# Patient Record
Sex: Female | Born: 1990 | Hispanic: No | Marital: Single | Smoking: Never smoker
Health system: Southern US, Community
[De-identification: ages and names within clinical notes are randomized; demographics above are authoritative.]

## PROBLEM LIST (undated history)

## (undated) DIAGNOSIS — R011 Cardiac murmur, unspecified: Secondary | ICD-10-CM

## (undated) HISTORY — PX: ELBOW SURGERY: SHX618

---

## 2015-08-10 ENCOUNTER — Encounter (HOSPITAL_COMMUNITY): Payer: Self-pay | Admitting: Emergency Medicine

## 2015-08-10 ENCOUNTER — Observation Stay (HOSPITAL_COMMUNITY)
Admission: EM | Admit: 2015-08-10 | Discharge: 2015-08-12 | Disposition: A | Discharge status: Home / Self Care | Payer: Self-pay | Attending: Emergency Medicine | Admitting: Emergency Medicine

## 2015-08-10 DIAGNOSIS — K3580 Unspecified acute appendicitis: Secondary | ICD-10-CM | POA: Diagnosis present

## 2015-08-10 DIAGNOSIS — K353 Acute appendicitis with localized peritonitis, without perforation or gangrene: Secondary | ICD-10-CM

## 2015-08-10 HISTORY — DX: Cardiac murmur, unspecified: R01.1

## 2015-08-10 NOTE — ED Triage Notes (Signed)
Pt c/o RLQ abd cramping radiating to low pelvis onset yesterday, worsening today with emesis x 3 today.

## 2015-08-10 NOTE — ED Provider Notes (Signed)
[facility]-EMERGENCY DEPT Provider Note   CSN: [id number] Arrival date & time: 08/10/15  2221  First Provider Contact:  First MD Initiated Contact with Patient 08/10/15 2343   By signing my name below, I, Erica Hinton, attest that this documentation has been prepared under the direction and in the presence of Erica Anis, PA-C. Electronically Signed: Bridgette Hinton, ED Scribe. 08/10/15. 11:49 PM.    History   Chief Complaint Chief Complaint  Patient presents with  . Abdominal Pain    HPI Comments: Erica Hinton is a 25 y.o. female who presents to the Emergency Department complaining of sudden onset, constant, cramping RLQ abdominal pain radiating to low pelvis onset one day ago. Pt states the pain worsened today. Pt also has associated vomiting (x3 episodes) onset today. She reports that this feels like menstrual cramping but notes this is significantly worse. Per pt, pain is exacerbated with movement. She states she has normal appetite. Her LNMP was 4 days ago. Denies h/o abdominal surgeries. Pt denies fever, vaginal discharge, chest pain, SOB, and nausea.   The history is provided by the patient. No language interpreter was used.    Past Medical History:  Diagnosis Date  . Heart murmur     There are no active problems to display for this patient.   Past Surgical History:  Procedure Laterality Date  . ELBOW SURGERY      OB History    No data available       Home Medications    Prior to Admission medications   Medication Sig Start Date End Date Taking? Authorizing Provider  BIOTIN PO Take 1 tablet by mouth daily.   Yes Historical Provider, MD    Family History No family history on file.  Social History Social History  Substance Use Topics  . Smoking status: Never Smoker  . Smokeless tobacco: Never Used  . Alcohol use No     Allergies   Review of patient's allergies indicates no known allergies.   Review of Systems Review of Systems  Constitutional: Negative  for appetite change and fever.  Respiratory: Negative for shortness of breath.   Cardiovascular: Negative for chest pain.  Gastrointestinal: Positive for abdominal pain and vomiting. Negative for nausea.  Genitourinary: Negative for vaginal discharge.     Physical Exam Updated Vital Signs BP 123/63   Pulse 77   Temp 98.6 F (37 C) (Oral)   Resp 18   LMP 08/06/2015   SpO2 98%   Physical Exam  Constitutional: She appears well-developed and well-nourished.  HENT:  Head: Normocephalic.  Eyes: Conjunctivae are normal.  Cardiovascular: Normal rate, regular rhythm and normal heart sounds.  Exam reveals no gallop and no friction rub.   No murmur heard. Pulmonary/Chest: Effort normal and breath sounds normal. No respiratory distress. She has no wheezes. She has no rales.  Abdominal: Soft. She exhibits no distension. There is tenderness. There is rebound.  RLQ tenderness over McBurney's Point with rebounding.  Genitourinary: Uterus is not enlarged and not tender. Cervix exhibits no motion tenderness, no discharge and no friability. Right adnexum displays no tenderness. Left adnexum displays no tenderness. No erythema or tenderness in the vagina. No vaginal discharge found.  Genitourinary Comments: Examination chaperoned.  Musculoskeletal: Normal range of motion.  Neurological: She is alert.  Skin: Skin is warm and dry.  Psychiatric: She has a normal mood and affect. Her behavior is normal.  Nursing note and vitals reviewed.    ED Treatments / Results  DIAGNOSTIC STUDIES: Oxygen Saturation  is 100% on RA, normal by my interpretation.    COORDINATION OF CARE: 11:45 PM Discussed treatment plan with pt at bedside which includes pelvic exam and pt agreed to plan.  Labs (all labs ordered are listed, but only abnormal results are displayed) Labs Reviewed  WET PREP, GENITAL - Abnormal; Notable for the following:       Result Value   Clue Cells Wet Prep HPF POC PRESENT (*)    WBC, Wet  Prep HPF POC FEW (*)    All other components within normal limits  COMPREHENSIVE METABOLIC PANEL - Abnormal; Notable for the following:    Glucose, Bld 124 (*)    Calcium 10.6 (*)    Total Protein 8.5 (*)    Total Bilirubin 1.4 (*)    All other components within normal limits  CBC - Abnormal; Notable for the following:    WBC 23.0 (*)    All other components within normal limits  URINALYSIS, ROUTINE W REFLEX MICROSCOPIC (NOT AT [facility]) - Abnormal; Notable for the following:    APPearance CLOUDY (*)    Ketones, ur 40 (*)    All other components within normal limits  LIPASE, BLOOD  POC URINE PREG, ED  GC/CHLAMYDIA PROBE AMP ([place]) NOT AT [facility]    EKG  EKG Interpretation None       Radiology No results found.  Procedures Procedures (including critical care time)  Medications Ordered in ED Medications  morphine 4 MG/ML injection 4 mg (4 mg Intravenous Given 08/11/15 0044)  ondansetron (ZOFRAN) injection 4 mg (4 mg Intravenous Given 08/11/15 0047)     Initial Impression / Assessment and Plan / ED Course  I have reviewed the triage vital signs and the nursing notes.  Pertinent labs & imaging results that were available during my care of the patient were reviewed by me and considered in my medical decision making (see chart for details).  Clinical Course    Patient with RLQ/pelvic abdominal pain x 1 day. No fever or diarrhea. Vomiting only with PO intake. Perserved appetite.  Leukocytosis elevated to 23K. Pelvic exam essentially negative. Suspect appendicitis with focal RLQ tenderness and rebounding.   2:40 - CT showing acute appendicitis uncomplicated by rupture or abscess. General surgery paged.   2:45 - discussed with Dr. Maisie Fus who advises holding orders for admission. She will follow in hospital.  Final Clinical Impressions(s) / ED Diagnoses   Final diagnoses:  None   1. Acute appendicitis  New Prescriptions New Prescriptions   No medications on file    I personally performed the services described in this documentation, which was scribed in my presence. The recorded information has been reviewed and is accurate.      Erica Anis, PA-C 08/11/15 0244    Erica Palumbo, MD 08/11/15 [phone]

## 2015-08-11 ENCOUNTER — Inpatient Hospital Stay (HOSPITAL_COMMUNITY): Payer: Self-pay | Admitting: Certified Registered Nurse Anesthetist

## 2015-08-11 ENCOUNTER — Encounter (HOSPITAL_COMMUNITY): Payer: Self-pay | Admitting: Radiology

## 2015-08-11 ENCOUNTER — Emergency Department (HOSPITAL_COMMUNITY): Payer: Self-pay

## 2015-08-11 ENCOUNTER — Encounter (HOSPITAL_COMMUNITY): Admission: EM | Disposition: A | Discharge status: Home / Self Care | Payer: Self-pay | Source: Home / Self Care

## 2015-08-11 DIAGNOSIS — K3580 Unspecified acute appendicitis: Secondary | ICD-10-CM | POA: Diagnosis present

## 2015-08-11 HISTORY — PX: LAPAROSCOPIC APPENDECTOMY: SHX408

## 2015-08-11 LAB — COMPREHENSIVE METABOLIC PANEL
ALBUMIN: 4.8 g/dL (ref 3.5–5.0)
ALT: 15 U/L (ref 14–54)
ANION GAP: 9 (ref 5–15)
AST: 18 U/L (ref 15–41)
Alkaline Phosphatase: 52 U/L (ref 38–126)
BILIRUBIN TOTAL: 1.4 mg/dL — AB (ref 0.3–1.2)
BUN: 6 mg/dL (ref 6–20)
CHLORIDE: 103 mmol/L (ref 101–111)
CO2: 23 mmol/L (ref 22–32)
Calcium: 10.6 mg/dL — ABNORMAL HIGH (ref 8.9–10.3)
Creatinine, Ser: 0.74 mg/dL (ref 0.44–1.00)
GFR calc Af Amer: >60 mL/min (ref >60)
GFR calc non Af Amer: >60 mL/min (ref >60)
GLUCOSE: 124 mg/dL — AB (ref 65–99)
POTASSIUM: 3.8 mmol/L (ref 3.5–5.1)
SODIUM: 135 mmol/L (ref 135–145)
TOTAL PROTEIN: 8.5 g/dL — AB (ref 6.5–8.1)

## 2015-08-11 LAB — URINALYSIS, ROUTINE W REFLEX MICROSCOPIC
BILIRUBIN URINE: NEGATIVE
GLUCOSE, UA: NEGATIVE mg/dL
Hgb urine dipstick: NEGATIVE
Ketones, ur: 40 mg/dL — AB
LEUKOCYTES UA: NEGATIVE
NITRITE: NEGATIVE
PH: 6 (ref 5.0–8.0)
Protein, ur: NEGATIVE mg/dL
SPECIFIC GRAVITY, URINE: 1.008 (ref 1.005–1.030)

## 2015-08-11 LAB — POC URINE PREG, ED: Preg Test, Ur: NEGATIVE

## 2015-08-11 LAB — CBC
HEMATOCRIT: 42.8 % (ref 36.0–46.0)
HEMOGLOBIN: 15 g/dL (ref 12.0–15.0)
MCH: 32.5 pg (ref 26.0–34.0)
MCHC: 35 g/dL (ref 30.0–36.0)
MCV: 92.6 fL (ref 78.0–100.0)
Platelets: 261 10*3/uL (ref 150–400)
RBC: 4.62 MIL/uL (ref 3.87–5.11)
RDW: 12.7 % (ref 11.5–15.5)
WBC: 23 10*3/uL — ABNORMAL HIGH (ref 4.0–10.5)

## 2015-08-11 LAB — WET PREP, GENITAL
SPERM: NONE SEEN
TRICH WET PREP: NONE SEEN
YEAST WET PREP: NONE SEEN

## 2015-08-11 LAB — LIPASE, BLOOD: LIPASE: 36 U/L (ref 11–51)

## 2015-08-11 SURGERY — APPENDECTOMY, LAPAROSCOPIC
Anesthesia: General | Site: Abdomen

## 2015-08-11 MED ORDER — MORPHINE SULFATE (PF) 4 MG/ML IV SOLN
4.0000 mg | Freq: Once | INTRAVENOUS | Status: AC
  Administered 2015-08-11: 4 mg via INTRAVENOUS
  Filled 2015-08-11: qty 1

## 2015-08-11 MED ORDER — PROPOFOL 10 MG/ML IV BOLUS
INTRAVENOUS | Status: AC
  Filled 2015-08-11: qty 20

## 2015-08-11 MED ORDER — FENTANYL CITRATE (PF) 100 MCG/2ML IJ SOLN
25.0000 ug | INTRAMUSCULAR | Status: DC | PRN
  Administered 2015-08-11: 50 ug via INTRAVENOUS

## 2015-08-11 MED ORDER — LIDOCAINE 2% (20 MG/ML) 5 ML SYRINGE
INTRAMUSCULAR | Status: DC | PRN
  Administered 2015-08-11: 100 mg via INTRAVENOUS

## 2015-08-11 MED ORDER — FENTANYL CITRATE (PF) 250 MCG/5ML IJ SOLN
INTRAMUSCULAR | Status: AC
  Filled 2015-08-11: qty 5

## 2015-08-11 MED ORDER — GLYCOPYRROLATE 0.2 MG/ML IJ SOLN
INTRAMUSCULAR | Status: DC | PRN
  Administered 2015-08-11: 0.4 mg via INTRAVENOUS

## 2015-08-11 MED ORDER — ONDANSETRON 4 MG PO TBDP: 4.0000 mg | ORAL_TABLET | Freq: Four times a day (QID) | ORAL | Status: DC | PRN

## 2015-08-11 MED ORDER — ONDANSETRON HCL 4 MG/2ML IJ SOLN: 4.0000 mg | Freq: Four times a day (QID) | INTRAMUSCULAR | Status: DC | PRN

## 2015-08-11 MED ORDER — NEOSTIGMINE METHYLSULFATE 10 MG/10ML IV SOLN
INTRAVENOUS | Status: DC | PRN
  Administered 2015-08-11: 3 mg via INTRAVENOUS

## 2015-08-11 MED ORDER — LACTATED RINGERS IV SOLN
INTRAVENOUS | Status: DC | PRN
  Administered 2015-08-11: 08:00:00 via INTRAVENOUS

## 2015-08-11 MED ORDER — MORPHINE SULFATE (PF) 2 MG/ML IV SOLN
2.0000 mg | INTRAVENOUS | Status: DC | PRN
  Administered 2015-08-11: 2 mg via INTRAVENOUS
  Administered 2015-08-11: 4 mg via INTRAVENOUS
  Administered 2015-08-11 – 2015-08-12 (×4): 2 mg via INTRAVENOUS
  Filled 2015-08-11 (×2): qty 1
  Filled 2015-08-11: qty 2
  Filled 2015-08-11 (×4): qty 1

## 2015-08-11 MED ORDER — LACTATED RINGERS IV SOLN: INTRAVENOUS | Status: DC

## 2015-08-11 MED ORDER — BUPIVACAINE-EPINEPHRINE 0.25% -1:200000 IJ SOLN
INTRAMUSCULAR | Status: AC
  Filled 2015-08-11: qty 1

## 2015-08-11 MED ORDER — SIMETHICONE 80 MG PO CHEW
40.0000 mg | CHEWABLE_TABLET | Freq: Four times a day (QID) | ORAL | Status: DC | PRN
  Administered 2015-08-11: 40 mg via ORAL
  Filled 2015-08-11: qty 1

## 2015-08-11 MED ORDER — DEXTROSE 5 % IV SOLN
2.0000 g | Freq: Once | INTRAVENOUS | Status: AC
  Administered 2015-08-11: 2 g via INTRAVENOUS
  Filled 2015-08-11: qty 2

## 2015-08-11 MED ORDER — FENTANYL CITRATE (PF) 100 MCG/2ML IJ SOLN
INTRAMUSCULAR | Status: AC
  Filled 2015-08-11: qty 2

## 2015-08-11 MED ORDER — BISACODYL 10 MG RE SUPP: 10.0000 mg | Freq: Every day | RECTAL | Status: DC | PRN

## 2015-08-11 MED ORDER — PROMETHAZINE HCL 25 MG/ML IJ SOLN: 6.2500 mg | INTRAMUSCULAR | Status: DC | PRN

## 2015-08-11 MED ORDER — ROCURONIUM BROMIDE 100 MG/10ML IV SOLN
INTRAVENOUS | Status: DC | PRN
  Administered 2015-08-11: 30 mg via INTRAVENOUS

## 2015-08-11 MED ORDER — SUCCINYLCHOLINE CHLORIDE 200 MG/10ML IV SOSY
PREFILLED_SYRINGE | INTRAVENOUS | Status: DC | PRN
  Administered 2015-08-11: 80 mg via INTRAVENOUS

## 2015-08-11 MED ORDER — LIDOCAINE HCL (CARDIAC) 20 MG/ML IV SOLN
INTRAVENOUS | Status: AC
  Filled 2015-08-11: qty 5

## 2015-08-11 MED ORDER — MIDAZOLAM HCL 2 MG/2ML IJ SOLN
INTRAMUSCULAR | Status: AC
  Filled 2015-08-11: qty 2

## 2015-08-11 MED ORDER — ROCURONIUM BROMIDE 100 MG/10ML IV SOLN
INTRAVENOUS | Status: AC
  Filled 2015-08-11: qty 1

## 2015-08-11 MED ORDER — SODIUM CHLORIDE 0.9 % IV SOLN
INTRAVENOUS | Status: AC
  Administered 2015-08-11: 05:00:00 via INTRAVENOUS

## 2015-08-11 MED ORDER — IOPAMIDOL (ISOVUE-300) INJECTION 61%
100.0000 mL | Freq: Once | INTRAVENOUS | Status: AC | PRN
  Administered 2015-08-11: 100 mL via INTRAVENOUS

## 2015-08-11 MED ORDER — METRONIDAZOLE IN NACL 5-0.79 MG/ML-% IV SOLN
500.0000 mg | Freq: Once | INTRAVENOUS | Status: AC
  Administered 2015-08-11: 500 mg via INTRAVENOUS
  Filled 2015-08-11: qty 100

## 2015-08-11 MED ORDER — NEOSTIGMINE METHYLSULFATE 10 MG/10ML IV SOLN
INTRAVENOUS | Status: AC
  Filled 2015-08-11: qty 1

## 2015-08-11 MED ORDER — PROPOFOL 10 MG/ML IV BOLUS
INTRAVENOUS | Status: DC | PRN
  Administered 2015-08-11: 150 mg via INTRAVENOUS

## 2015-08-11 MED ORDER — KCL IN DEXTROSE-NACL 20-5-0.45 MEQ/L-%-% IV SOLN
INTRAVENOUS | Status: DC
  Administered 2015-08-11: 11:00:00 via INTRAVENOUS
  Filled 2015-08-11 (×2): qty 1000

## 2015-08-11 MED ORDER — GLYCOPYRROLATE 0.2 MG/ML IJ SOLN
INTRAMUSCULAR | Status: AC
  Filled 2015-08-11: qty 2

## 2015-08-11 MED ORDER — SENNOSIDES-DOCUSATE SODIUM 8.6-50 MG PO TABS
1.0000 | ORAL_TABLET | Freq: Every day | ORAL | Status: DC
  Administered 2015-08-11: 1 via ORAL
  Filled 2015-08-11: qty 1

## 2015-08-11 MED ORDER — SODIUM CHLORIDE 0.9 % IV SOLN: INTRAVENOUS | Status: DC

## 2015-08-11 MED ORDER — ONDANSETRON HCL 4 MG/2ML IJ SOLN
INTRAMUSCULAR | Status: DC | PRN
  Administered 2015-08-11: 4 mg via INTRAVENOUS

## 2015-08-11 MED ORDER — FENTANYL CITRATE (PF) 100 MCG/2ML IJ SOLN
INTRAMUSCULAR | Status: DC | PRN
  Administered 2015-08-11: 50 ug via INTRAVENOUS
  Administered 2015-08-11: 100 ug via INTRAVENOUS
  Administered 2015-08-11 (×2): 50 ug via INTRAVENOUS

## 2015-08-11 MED ORDER — RINGERS IRRIGATION IR SOLN
Status: DC | PRN
  Administered 2015-08-11: 1000 mL

## 2015-08-11 MED ORDER — DEXAMETHASONE SODIUM PHOSPHATE 10 MG/ML IJ SOLN
INTRAMUSCULAR | Status: DC | PRN
  Administered 2015-08-11: 10 mg via INTRAVENOUS

## 2015-08-11 MED ORDER — HYDROCODONE-ACETAMINOPHEN 5-325 MG PO TABS
1.0000 | ORAL_TABLET | ORAL | Status: DC | PRN
  Administered 2015-08-11 (×2): 2 via ORAL
  Filled 2015-08-11 (×3): qty 2

## 2015-08-11 MED ORDER — MEPERIDINE HCL 50 MG/ML IJ SOLN: 6.2500 mg | INTRAMUSCULAR | Status: DC | PRN

## 2015-08-11 MED ORDER — ONDANSETRON HCL 4 MG/2ML IJ SOLN
INTRAMUSCULAR | Status: AC
  Filled 2015-08-11: qty 2

## 2015-08-11 MED ORDER — DEXAMETHASONE SODIUM PHOSPHATE 10 MG/ML IJ SOLN
INTRAMUSCULAR | Status: AC
  Filled 2015-08-11: qty 1

## 2015-08-11 MED ORDER — MIDAZOLAM HCL 5 MG/5ML IJ SOLN
INTRAMUSCULAR | Status: DC | PRN
  Administered 2015-08-11: 2 mg via INTRAVENOUS

## 2015-08-11 MED ORDER — ENOXAPARIN SODIUM 40 MG/0.4ML ~~LOC~~ SOLN
40.0000 mg | SUBCUTANEOUS | Status: DC
  Administered 2015-08-12: 40 mg via SUBCUTANEOUS
  Filled 2015-08-11: qty 0.4

## 2015-08-11 MED ORDER — ONDANSETRON HCL 4 MG/2ML IJ SOLN
4.0000 mg | Freq: Once | INTRAMUSCULAR | Status: AC
  Administered 2015-08-11: 4 mg via INTRAVENOUS
  Filled 2015-08-11: qty 2

## 2015-08-11 MED ORDER — 0.9 % SODIUM CHLORIDE (POUR BTL) OPTIME
TOPICAL | Status: DC | PRN
Start: 2015-08-11 — End: 2015-08-11
  Administered 2015-08-11: 1000 mL

## 2015-08-11 SURGICAL SUPPLY — 38 items
APPLIER CLIP 5 13 M/L LIGAMAX5 (MISCELLANEOUS)
APPLIER CLIP ROT 10 11.4 M/L (STAPLE)
CABLE HIGH FREQUENCY MONO STRZ (ELECTRODE) ×3 IMPLANT
CHLORAPREP W/TINT 26ML (MISCELLANEOUS) ×3 IMPLANT
CLIP APPLIE 5 13 M/L LIGAMAX5 (MISCELLANEOUS) IMPLANT
CLIP APPLIE ROT 10 11.4 M/L (STAPLE) IMPLANT
COVER SURGICAL LIGHT HANDLE (MISCELLANEOUS) ×3 IMPLANT
DECANTER SPIKE VIAL GLASS SM (MISCELLANEOUS) ×3 IMPLANT
DEVICE PMI PUNCTURE CLOSURE (MISCELLANEOUS) IMPLANT
DRAPE LAPAROSCOPIC ABDOMINAL (DRAPES) ×3 IMPLANT
ELECT REM PT RETURN 9FT ADLT (ELECTROSURGICAL) ×3
ELECTRODE REM PT RTRN 9FT ADLT (ELECTROSURGICAL) ×1 IMPLANT
GLOVE BIO SURGEON STRL SZ 6.5 (GLOVE) ×2 IMPLANT
GLOVE BIO SURGEONS STRL SZ 6.5 (GLOVE) ×1
GLOVE BIOGEL PI IND STRL 7.0 (GLOVE) ×1 IMPLANT
GLOVE BIOGEL PI INDICATOR 7.0 (GLOVE) ×2
GOWN STRL REUS W/TWL 2XL LVL3 (GOWN DISPOSABLE) ×3 IMPLANT
GOWN STRL REUS W/TWL XL LVL3 (GOWN DISPOSABLE) ×3 IMPLANT
HANDLE STAPLE EGIA 4 XL (STAPLE) ×3 IMPLANT
IRRIG SUCT STRYKERFLOW 2 WTIP (MISCELLANEOUS) ×3
IRRIGATION SUCT STRKRFLW 2 WTP (MISCELLANEOUS) ×1 IMPLANT
KIT BASIN OR (CUSTOM PROCEDURE TRAY) ×3 IMPLANT
LIQUID BAND (GAUZE/BANDAGES/DRESSINGS) ×3 IMPLANT
MARKER SKIN DUAL TIP RULER LAB (MISCELLANEOUS) IMPLANT
POUCH SPECIMEN RETRIEVAL 10MM (ENDOMECHANICALS) ×3 IMPLANT
RELOAD EGIA 45 MED/THCK PURPLE (STAPLE) ×3 IMPLANT
RELOAD EGIA 45 TAN VASC (STAPLE) ×3 IMPLANT
RELOAD EGIA 60 MED/THCK PURPLE (STAPLE) IMPLANT
RELOAD EGIA 60 TAN VASC (STAPLE) IMPLANT
SCISSORS LAP 5X35 DISP (ENDOMECHANICALS) ×3 IMPLANT
SLEEVE XCEL OPT CAN 5 100 (ENDOMECHANICALS) ×3 IMPLANT
SUT VIC AB 4-0 PS2 27 (SUTURE) ×3 IMPLANT
TOWEL OR 17X26 10 PK STRL BLUE (TOWEL DISPOSABLE) ×3 IMPLANT
TRAY FOLEY W/METER SILVER 14FR (SET/KITS/TRAYS/PACK) ×3 IMPLANT
TRAY FOLEY W/METER SILVER 16FR (SET/KITS/TRAYS/PACK) IMPLANT
TRAY LAPAROSCOPIC (CUSTOM PROCEDURE TRAY) ×3 IMPLANT
TROCAR BLADELESS OPT 5 100 (ENDOMECHANICALS) ×3 IMPLANT
TROCAR XCEL BLUNT TIP 100MML (ENDOMECHANICALS) ×3 IMPLANT

## 2015-08-11 NOTE — ED Notes (Signed)
Pelvic supplies at bedside. 

## 2015-08-11 NOTE — Anesthesia Postprocedure Evaluation (Signed)
Anesthesia Post Note  Patient: Erica Hinton  Procedure(s) Performed: Procedure(s) (LRB): APPENDECTOMY LAPAROSCOPIC (N/A)  Patient location during evaluation: PACU Anesthesia Type: General Level of consciousness: awake and alert Pain management: pain level controlled Vital Signs Assessment: post-procedure vital signs reviewed and stable Respiratory status: spontaneous breathing, nonlabored ventilation, respiratory function stable and patient connected to nasal cannula oxygen Cardiovascular status: blood pressure returned to baseline and stable Postop Assessment: no signs of nausea or vomiting Anesthetic complications: no    Last Vitals:  Vitals:   08/11/15 0929 08/11/15 0930  BP: 132/63 127/65  Pulse: 69 77  Resp: 20 18  Temp:      Last Pain:  Vitals:   08/11/15 0930  TempSrc:   PainSc: 6                  Sebastian Ache

## 2015-08-11 NOTE — Op Note (Signed)
Erica Hinton [id number]   PRE-OPERATIVE DIAGNOSIS:  appendicitis  POST-OPERATIVE DIAGNOSIS:  Acute appendicitis   Procedure(s): APPENDECTOMY LAPAROSCOPIC   Surgeon(s): Romie Levee, MD  ASSISTANT: none   ANESTHESIA:   local and general  EBL:   min  Delay start of Pharmacological VTE agent (>24hrs) due to surgical blood loss or risk of bleeding:  no  DRAINS: none   SPECIMEN:  Source of Specimen:  appendix  DISPOSITION OF SPECIMEN:  PATHOLOGY  COUNTS:  YES  PLAN OF CARE: Discharge to home after PACU  PATIENT DISPOSITION:  PACU - hemodynamically stable.   INDICATIONS: Patient with concerning symptoms & work up suspicious for appendicitis.  Surgery was recommended:  The anatomy & physiology of the digestive tract was discussed.  The pathophysiology of appendicitis was discussed.  Natural history risks without surgery was discussed.   I feel the risks of no intervention will lead to serious problems that outweigh the operative risks; therefore, I recommended diagnostic laparoscopy with removal of appendix to remove the pathology.  Laparoscopic & open techniques were discussed.   I noted a good likelihood this will help address the problem.    Risks such as bleeding, infection, abscess, leak, reoperation, possible ostomy, hernia, heart attack, death, and other risks were discussed.  Goals of post-operative recovery were discussed as well.  We will work to minimize complications.  Questions were answered.  The patient expresses understanding & wishes to proceed with surgery.  OR FINDINGS: acute appendicitis   DESCRIPTION:   The patient was identified & brought into the operating room. The patient was positioned supine with left arm tucked. SCDs were active during the entire case. The patient underwent general anesthesia without any difficulty.  A foley catheter was inserted under sterile conditions. The abdomen was prepped and draped in a sterile fashion. A Surgical Timeout  confirmed our plan.   I made a transverse incision through the superior umbilical fold.  I made a nick in the infraumbilical fascia and confirmed peritoneal entry.  I placed a stay suture and then the [facility] port.  We induced carbon dioxide insufflation.  Camera inspection revealed no injury.  I placed additional ports under direct laparoscopic visualization.  I mobilized the terminal ileum to proximal ascending colon in a lateral to medial fashion.  I took care to avoid injuring any retroperitoneal structures.   I freed the appendix off its attachments to the ascending colon and cecal mesentery.  I elevated the appendix.  I was able to free off the base of the appendix, which was still viable.  I stapled the appendix off the cecum using a laparoscopic bowel load stapler.  I took a healthy cuff viable cecum. I skeletonized & ligated the mesoappendix with a vascular load stapler.  I placed the appendix inside an EndoCatch bag and removed out the Dexter port.  I did copious irrigation. Hemostasis was good in the mesoappendix, colon mesentery, and retroperitoneum. Staple line was intact on the cecum with no bleeding. I washed out the pelvis, retrohepatic space and right paracolic gutter.  Hemostasis is good. There was no perforation or injury.  Because the area cleaned up well after irrigation, I did not place a drain.  I aspirated the carbon dioxide. I removed the ports. I closed the umbilical fascia site using a 0 Vicryl stitch. I closed skin using 4-0 vicryl stitch.  Sterile dressings were applied.  Patient was extubated and sent to the recovery room.  I discussed the operative findings with the patient's family.  I suspect the patient is going used in the hospital at least overnight and will need antibiotics for 1 days. Questions answered. They expressed understanding and appreciation.

## 2015-08-11 NOTE — Transfer of Care (Signed)
Immediate Anesthesia Transfer of Care Note  Patient: Erica Hinton  Procedure(s) Performed: Procedure(s): APPENDECTOMY LAPAROSCOPIC (N/A)  Patient Location: PACU  Anesthesia Type:General  Level of Consciousness:  sedated, patient cooperative and responds to stimulation  Airway & Oxygen Therapy:Patient Spontanous Breathing and Patient connected to face mask oxgen  Post-op Assessment:  Report given to PACU RN and Post -op Vital signs reviewed and stable  Post vital signs:  Reviewed and stable  Last Vitals:  Vitals:   08/11/15 0345 08/11/15 0602  BP: 115/67 (!) 98/57  Pulse: 69 72  Resp: 15 16  Temp: 36.9 C 37.1 C    Complications: No apparent anesthesia complications

## 2015-08-11 NOTE — H&P (Signed)
Erica Hinton is an 24 y.o. female.   Chief Complaint: abd pain, nausea  HPI: Pt states she had rapid onset of abd pain associated with nausea and vomiting that began yesterday.  The pain worsened and localized to her pelvis.  She denies any changes in bowel habits.    Past Medical History:  Diagnosis Date  . Heart murmur     Past Surgical History:  Procedure Laterality Date  . ELBOW SURGERY      History reviewed. No pertinent family history. Social History:  reports that she has never smoked. She has never used smokeless tobacco. She reports that she does not drink alcohol or use drugs.  Allergies: No Known Allergies  Medications Prior to Admission  Medication Sig Dispense Refill  . BIOTIN PO Take 1 tablet by mouth daily.      Results for orders placed or performed during the hospital encounter of 08/10/15 (from the past 48 hour(s))  Lipase, blood     Status: None   Collection Time: 08/10/15 11:57 PM  Result Value Ref Range   Lipase 36 11 - 51 U/L  Comprehensive metabolic panel     Status: Abnormal   Collection Time: 08/10/15 11:57 PM  Result Value Ref Range   Sodium 135 135 - 145 mmol/L   Potassium 3.8 3.5 - 5.1 mmol/L   Chloride 103 101 - 111 mmol/L   CO2 23 22 - 32 mmol/L   Glucose, Bld 124 (H) 65 - 99 mg/dL   BUN 6 6 - 20 mg/dL   Creatinine, Ser 0.74 0.44 - 1.00 mg/dL   Calcium 10.6 (H) 8.9 - 10.3 mg/dL   Total Protein 8.5 (H) 6.5 - 8.1 g/dL   Albumin 4.8 3.5 - 5.0 g/dL   AST 18 15 - 41 U/L   ALT 15 14 - 54 U/L   Alkaline Phosphatase 52 38 - 126 U/L   Total Bilirubin 1.4 (H) 0.3 - 1.2 mg/dL   GFR calc non Af Amer >60 >60 mL/min   GFR calc Af Amer >60 >60 mL/min    Comment: (NOTE) The eGFR has been calculated using the CKD EPI equation. This calculation has not been validated in all clinical situations. eGFR's persistently <60 mL/min signify possible Chronic Kidney Disease.    Anion gap 9 5 - 15  CBC     Status: Abnormal   Collection Time: 08/10/15  11:57 PM  Result Value Ref Range   WBC 23.0 (H) 4.0 - 10.5 K/uL   RBC 4.62 3.87 - 5.11 MIL/uL   Hemoglobin 15.0 12.0 - 15.0 g/dL   HCT 42.8 36.0 - 46.0 %   MCV 92.6 78.0 - 100.0 fL   MCH 32.5 26.0 - 34.0 pg   MCHC 35.0 30.0 - 36.0 g/dL   RDW 12.7 11.5 - 15.5 %   Platelets 261 150 - 400 K/uL  Urinalysis, Routine w reflex microscopic     Status: Abnormal   Collection Time: 08/11/15 12:00 AM  Result Value Ref Range   Color, Urine YELLOW YELLOW   APPearance CLOUDY (A) CLEAR   Specific Gravity, Urine 1.008 1.005 - 1.030   pH 6.0 5.0 - 8.0   Glucose, UA NEGATIVE NEGATIVE mg/dL   Hgb urine dipstick NEGATIVE NEGATIVE   Bilirubin Urine NEGATIVE NEGATIVE   Ketones, ur 40 (A) NEGATIVE mg/dL   Protein, ur NEGATIVE NEGATIVE mg/dL   Nitrite NEGATIVE NEGATIVE   Leukocytes, UA NEGATIVE NEGATIVE    Comment: MICROSCOPIC NOT DONE ON URINES WITH NEGATIVE PROTEIN,   BLOOD, LEUKOCYTES, NITRITE, OR GLUCOSE <1000 mg/dL.  POC urine preg, ED     Status: None   Collection Time: 08/11/15 12:04 AM  Result Value Ref Range   Preg Test, Ur NEGATIVE NEGATIVE    Comment:        THE SENSITIVITY OF THIS METHODOLOGY IS >24 mIU/mL   Wet prep, genital     Status: Abnormal   Collection Time: 08/11/15  1:00 AM  Result Value Ref Range   Yeast Wet Prep HPF POC NONE SEEN NONE SEEN   Trich, Wet Prep NONE SEEN NONE SEEN   Clue Cells Wet Prep HPF POC PRESENT (A) NONE SEEN   WBC, Wet Prep HPF POC FEW (A) NONE SEEN   Sperm NONE SEEN    Ct Abdomen Pelvis W Contrast  Result Date: 08/11/2015 CLINICAL DATA:  rlq cramping radiating to low pelvis since Thursday; worse on Friday 08/10/15; n/v x 3 days; elev wbc 23.0 EXAM: CT ABDOMEN AND PELVIS WITH CONTRAST TECHNIQUE: Multidetector CT imaging of the abdomen and pelvis was performed using the standard protocol following bolus administration of intravenous contrast. CONTRAST:  100mL ISOVUE-300 IOPAMIDOL (ISOVUE-300) INJECTION 61% COMPARISON:  None. FINDINGS: Lung bases: Clear.   Heart normal size. Liver, spleen, gallbladder, pancreas, adrenal glands:  Normal. Kidneys, ureters, bladder:  Normal. Uterus and adnexa:  Unremarkable. Lymph nodes:  No adenopathy. Gastrointestinal: The appendix is dilated to 12 mm. There is surrounding periappendiceal inflammation. No extraluminal air or defined collection to suggest an abscess. Stomach colon and small bowel are unremarkable. Musculoskeletal:  Unremarkable. IMPRESSION: 1. Acute appendicitis. No CT evidence of appendiceal rupture or abscess. Electronically Signed   By: David  Ormond M.D.   On: 08/11/2015 02:14   Review of Systems  Constitutional: Negative for chills and fever.  Eyes: Negative for blurred vision and double vision.  Respiratory: Negative for cough and shortness of breath.   Cardiovascular: Negative for chest pain and palpitations.  Gastrointestinal: Positive for abdominal pain, nausea and vomiting.  Genitourinary: Negative for dysuria, frequency and urgency.  Skin: Negative for itching and rash.  Neurological: Negative for dizziness and headaches.    Blood pressure (!) 98/57, pulse 72, temperature 98.7 F (37.1 C), resp. rate 16, last menstrual period 08/06/2015, SpO2 99 %. Physical Exam  Constitutional: She appears well-developed and well-nourished.  HENT:  Head: Normocephalic and atraumatic.  Eyes: Conjunctivae and EOM are normal. Pupils are equal, round, and reactive to light.  Neck: Normal range of motion. Neck supple.  Cardiovascular: Normal rate and regular rhythm.   Respiratory: Effort normal and breath sounds normal. No respiratory distress.  GI: Soft. She exhibits no distension. There is tenderness. There is no rebound and no guarding.  RLQ tender to palpation     Assessment/Plan Pt with acute appendicitis.  Admit to floor.   IV antibiotics.  OR today for appendectomy.  Risks of surgery were discussed with pt.  These include bleeding, pain, infection, bowel leak and damage to adjacent structures.   I believe she understands this and has agreed to proceed with surgery.    THOMAS, ALICIA C., MD 08/11/2015, 6:42 AM   

## 2015-08-11 NOTE — Anesthesia Preprocedure Evaluation (Addendum)
Anesthesia Evaluation  Patient identified by MRN, date of birth, ID band Patient awake    Reviewed: Allergy & Precautions, NPO status , Patient's Chart, lab work & pertinent test results  Airway Mallampati: II       Dental  (+) Teeth Intact, Dental Advisory Given   Pulmonary neg pulmonary ROS,    breath sounds clear to auscultation       Cardiovascular negative cardio ROS   Rhythm:Regular     Neuro/Psych negative neurological ROS  negative psych ROS   GI/Hepatic negative GI ROS, Neg liver ROS,   Endo/Other  negative endocrine ROS  Renal/GU negative Renal ROS  negative genitourinary   Musculoskeletal negative musculoskeletal ROS (+)   Abdominal   Peds negative pediatric ROS (+)  Hematology negative hematology ROS (+)   Anesthesia Other Findings   Reproductive/Obstetrics negative OB ROS PG test neg                            Anesthesia Physical Anesthesia Plan  ASA: II and emergent  Anesthesia Plan: General   Post-op Pain Management:    Induction: Intravenous, Rapid sequence and Cricoid pressure planned  Airway Management Planned: Oral ETT  Additional Equipment:   Intra-op Plan:   Post-operative Plan: Extubation in OR  Informed Consent: I have reviewed the patients History and Physical, chart, labs and discussed the procedure including the risks, benefits and alternatives for the proposed anesthesia with the patient or authorized representative who has indicated his/her understanding and acceptance.     Plan Discussed with:   Anesthesia Plan Comments: (WBC 23K)        Anesthesia Quick Evaluation

## 2015-08-11 NOTE — Progress Notes (Signed)
Pt. Arrived to floor via stretcher from ED. Pt. A&OX4, VSS c/o pain to RLQ rating a 9. Pain meds adminis tered as ordered. No nausea or vomiting noted. Pt remains NPO. Currently in no distress will continue to monitor.

## 2015-08-11 NOTE — Anesthesia Procedure Notes (Addendum)
Procedure Name: Intubation Date/Time: 08/11/2015 8:12 AM Performed by: Jhonnie Garner Pre-anesthesia Checklist: Patient identified, Emergency Drugs available and Suction available Patient Re-evaluated:Patient Re-evaluated prior to inductionOxygen Delivery Method: Circle system utilized Preoxygenation: Pre-oxygenation with 100% oxygen Intubation Type: IV induction, Rapid sequence and Cricoid Pressure applied Laryngoscope Size: Miller and 2 Grade View: Grade I Tube type: Oral Tube size: 7.0 mm Number of attempts: 1 Airway Equipment and Method: Stylet Placement Confirmation: ETT inserted through vocal cords under direct vision,  positive ETCO2 and breath sounds checked- equal and bilateral Secured at: 21 cm Tube secured with: Tape Dental Injury: Teeth and Oropharynx as per pre-operative assessment

## 2015-08-12 LAB — CBC
HCT: 38.3 % (ref 36.0–46.0)
HEMOGLOBIN: 13 g/dL (ref 12.0–15.0)
MCH: 31.6 pg (ref 26.0–34.0)
MCHC: 33.9 g/dL (ref 30.0–36.0)
MCV: 93 fL (ref 78.0–100.0)
Platelets: 223 10*3/uL (ref 150–400)
RBC: 4.12 MIL/uL (ref 3.87–5.11)
RDW: 12.6 % (ref 11.5–15.5)
WBC: 18.9 10*3/uL — AB (ref 4.0–10.5)

## 2015-08-12 MED ORDER — OXYCODONE-ACETAMINOPHEN 5-325 MG PO TABS
1.0000 | ORAL_TABLET | ORAL | Status: DC | PRN
  Administered 2015-08-12: 1 via ORAL
  Filled 2015-08-12: qty 1

## 2015-08-12 MED ORDER — OXYCODONE-ACETAMINOPHEN 5-325 MG PO TABS: 1.0000 | ORAL_TABLET | ORAL | 0 refills | Status: AC | PRN

## 2015-08-12 NOTE — Progress Notes (Signed)
Pt  Able  to obtain adequate relief from pain with oral medication. Tolerating food . Able to void and ambulate. Desires discharge. Discharge instructions discussed with patient and mother until no further questions ask. Discharge via wheel chair to private vehicle.

## 2015-08-12 NOTE — Discharge Summary (Signed)
Physician Discharge Summary  Patient ID: Erica Hinton MRN: [id number] DOB/AGE: 16-Nov-1990 25 y.o.  Admit date: 08/10/2015 Discharge date: 08/12/2015  Admission Diagnoses: acute appendicitis  Discharge Diagnoses:  Active Problems:   Acute appendicitis, uncomplicated   Acute appendicitis   Discharged Condition: good  Hospital Course: Patient admitted after lap appendectomy.  Her diet was advanced.  She was discharged once tolerating a diet, ambulating and tolerating PO pain meds.   Consults: None  Significant Diagnostic Studies: labs: cbc  Treatments: IV hydration, analgesia: Vicodin and surgery: lap appendectomy  Discharge Exam: Blood pressure 123/63, pulse (!) 57, temperature 98.4 F (36.9 C), temperature source Oral, resp. rate 14, height 5\' 8"  (1.727 m), last menstrual period 08/06/2015, SpO2 100 %. General appearance: alert and cooperative GI: normal findings: soft, non-distended Incision/Wound: clean, dry, intact  Disposition: Final discharge disposition not confirmed     Medication List    TAKE these medications   BIOTIN PO Take 1 tablet by mouth daily.   oxyCODONE-acetaminophen 5-325 MG tablet Commonly known as:  PERCOCET/ROXICET Take 1-2 tablets by mouth every 4 (four) hours as needed for moderate pain.      Follow-up Information    [facility], [place]. Schedule an appointment as soon as possible for a visit in 2 week(s).   Specialty:  General Surgery Contact information: [address] [place] [address] [phone]          Signed: Vanita Panda 08/12/2015, 10:25 AM

## 2015-08-12 NOTE — Discharge Instructions (Signed)
Laparoscopic Appendectomy, Adult, Care After °Refer to this sheet in the next few weeks. These instructions provide you with information on caring for yourself after your procedure. Your caregiver may also give you more specific instructions. Your treatment has been planned according to current medical practices, but problems sometimes occur. Call your caregiver if you have any problems or questions after your procedure. °HOME CARE INSTRUCTIONS °· Do not drive while taking narcotic pain medicines. °· Use stool softener if you become constipated from your pain medicines. °· Change your bandages (dressings) as directed. °· Keep your wounds clean and dry. You may wash the wounds gently with soap and water. Gently pat the wounds dry with a clean towel. °· Do not take baths, swim, or use hot tubs for 10 days, or as instructed by your caregiver. °· Only take over-the-counter or prescription medicines for pain, discomfort, or fever as directed by your caregiver. °· You may continue your normal diet as directed. °· Do not lift more than 10 pounds (4.5 kg) or play contact sports for 3 weeks, or as directed. °· Slowly increase your activity after surgery. °· Take deep breaths to avoid getting a lung infection (pneumonia). °SEEK MEDICAL CARE IF: °· You have redness, swelling, or increasing pain in your wounds. °· You have pus coming from your wounds. °· You have drainage from a wound that lasts longer than 1 day. °· You notice a bad smell coming from the wounds or dressing. °· Your wound edges break open after stitches (sutures) have been removed. °· You notice increasing pain in the shoulders (shoulder strap areas) or near your shoulder blades. °· You develop dizzy episodes or fainting while standing. °· You develop shortness of breath. °· You develop persistent nausea or vomiting. °· You cannot control your bowel functions or lose your appetite. °· You develop diarrhea. °SEEK IMMEDIATE MEDICAL CARE IF:  °· You have a  fever. °· You develop a rash. °· You have difficulty breathing or sharp pains in your chest. °· You develop any reaction or side effects to medicines given. °MAKE SURE YOU: °· Understand these instructions. °· Will watch your condition. °· Will get help right away if you are not doing well or get worse. °  °This information is not intended to replace advice given to you by your health care provider. Make sure you discuss any questions you have with your health care provider. °  °Document Released: 12/30/2004 Document Revised: 05/16/2014 Document Reviewed: 06/19/2014 °[employer] Interactive Patient Education ©2016 [employer] ° °

## 2015-08-13 ENCOUNTER — Encounter (HOSPITAL_COMMUNITY): Payer: Self-pay | Admitting: General Surgery

## 2015-08-13 LAB — GC/CHLAMYDIA PROBE AMP (~~LOC~~) NOT AT ARMC
CHLAMYDIA, DNA PROBE: NEGATIVE
Neisseria Gonorrhea: NEGATIVE

## 2017-02-21 IMAGING — CT CT ABD-PELV W/ CM
2 of 4 series · 16 of 46 positions shown, 18 images · IV contrast (ISOVUE)
Comparison: None.

CLINICAL DATA: rlq cramping radiating to low pelvis since [REDACTED];
worse on [REDACTED] 08/10/15; n/v x 3 days; elev wbc

EXAM:
CT ABDOMEN AND PELVIS WITH CONTRAST
TECHNIQUE: Multidetector CT imaging of the abdomen and pelvis was performed
using the standard protocol following bolus administration of
intravenous contrast.
CONTRAST:  100mL [id number] IOPAMIDOL ([id number]-[id number]) INJECTION 61%

[Series 2: abd/pel with · axial · 0.65mm/px · z∈[-448,-53]mm · 13 of 89 slices shown, 15 images]
[im 5/89  soft-tissue]
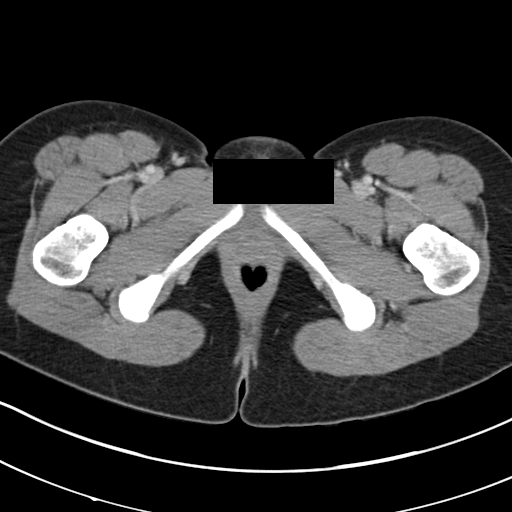
[im 5/89  bone]
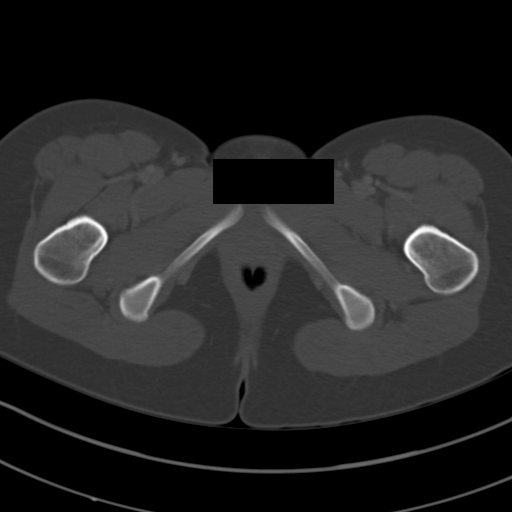
[im 14/89  soft-tissue]
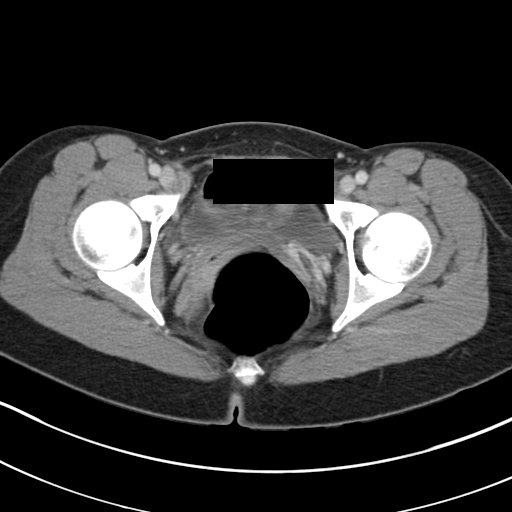
[im 19/89  soft-tissue]
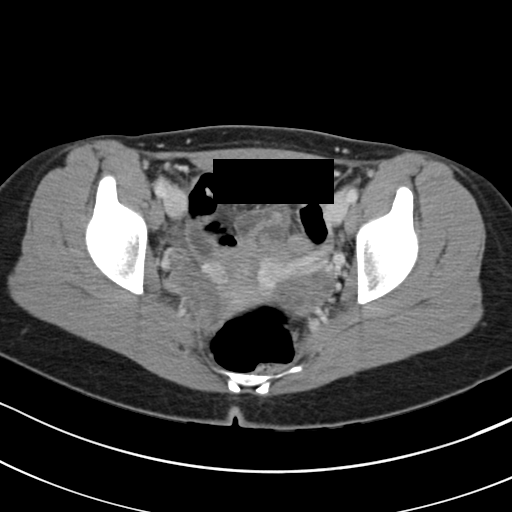
[im 24/89  soft-tissue]
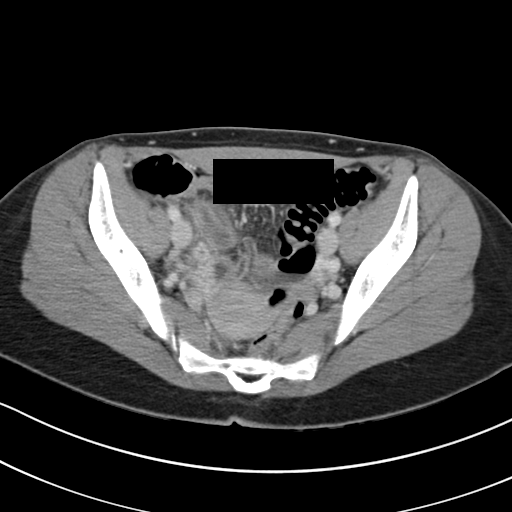
[im 33/89  soft-tissue]
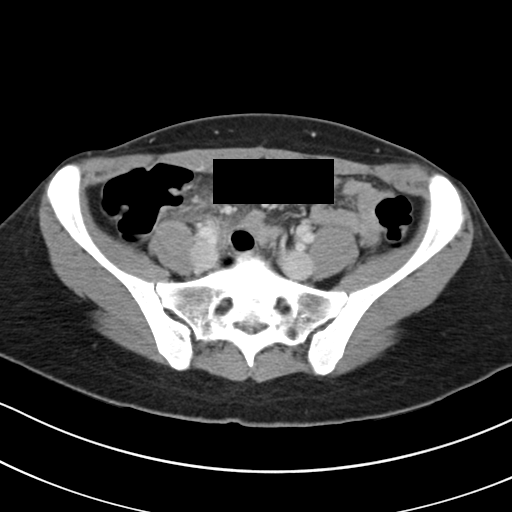
[im 38/89  soft-tissue]
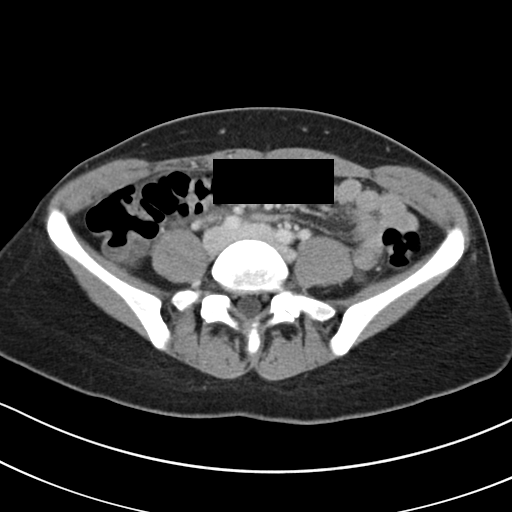
[im 47/89  soft-tissue]
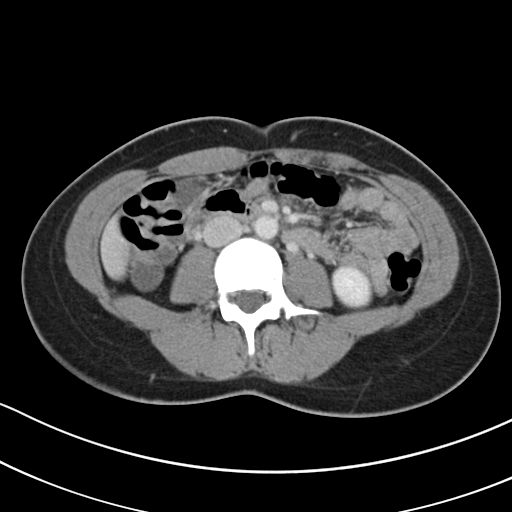
[im 51/89  soft-tissue]
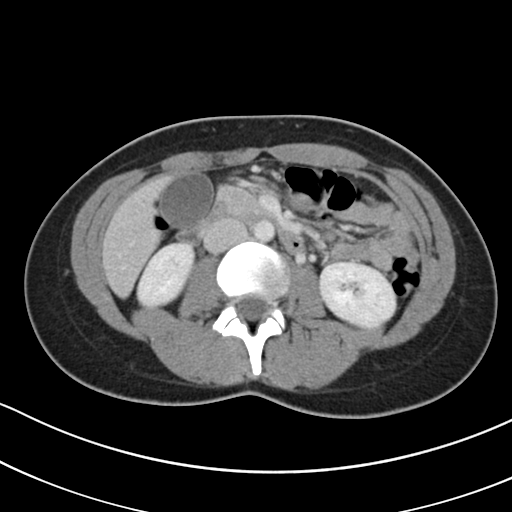
[im 56/89  soft-tissue]
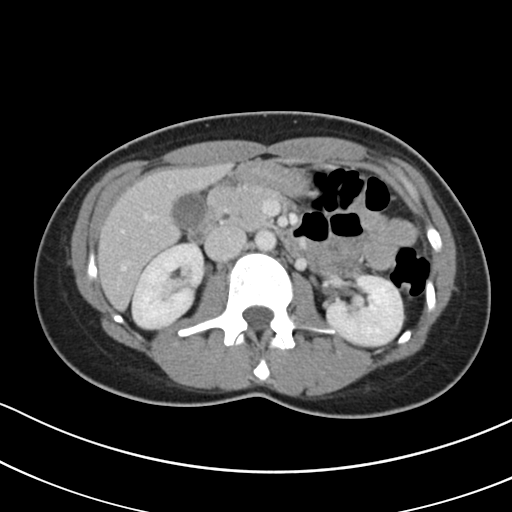
[im 56/89  bone]
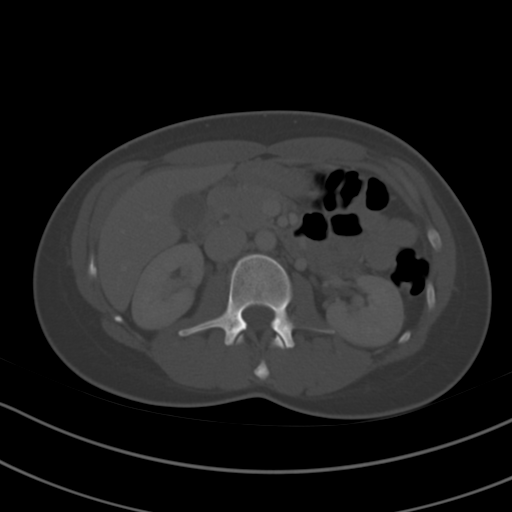
[im 65/89  soft-tissue]
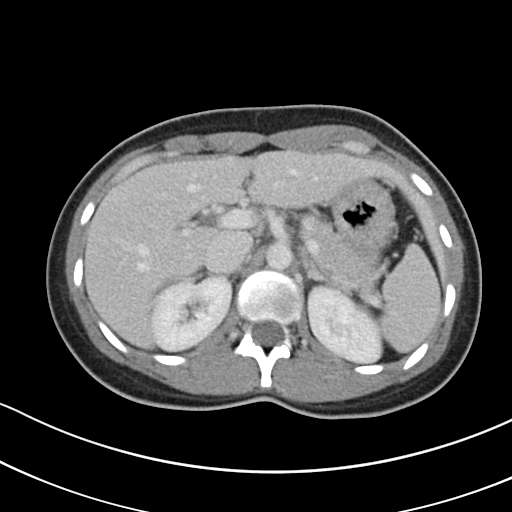
[im 70/89  soft-tissue]
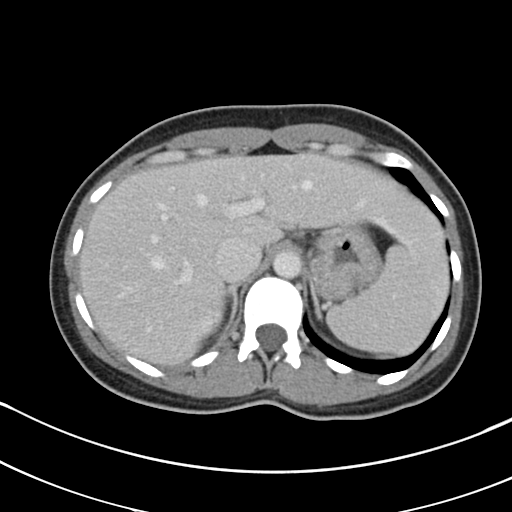
[im 75/89  soft-tissue]
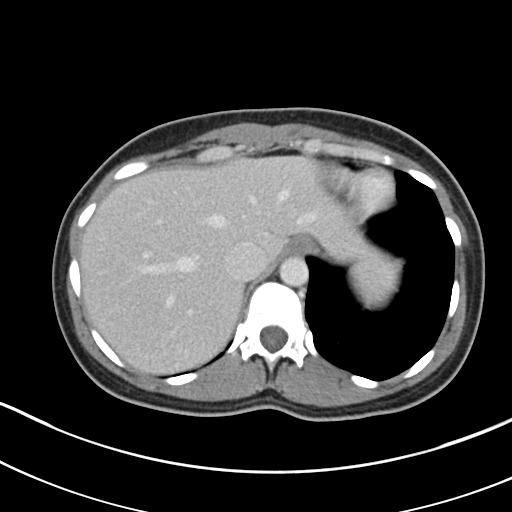
[im 84/89  soft-tissue]
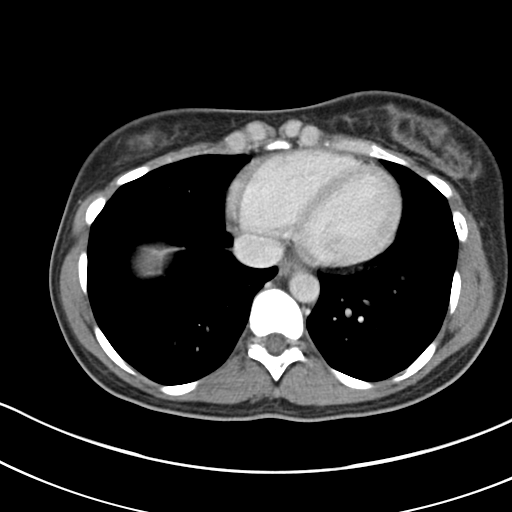

[Series 3: coronal a/|p · coronal · 0.63mm/px · 3 of 100 slices shown]
[im 34/100  soft-tissue]
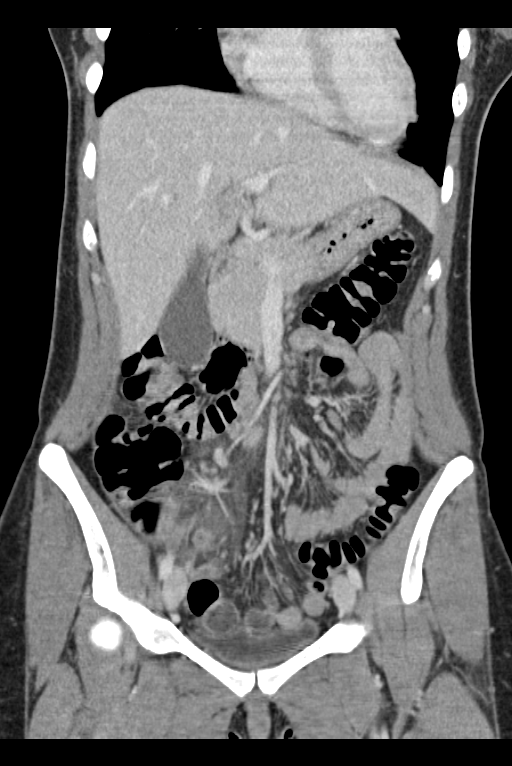
[im 45/100  soft-tissue]
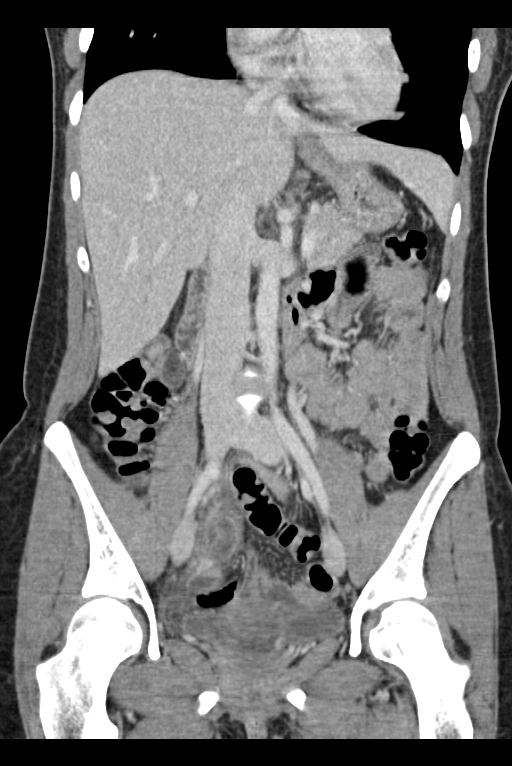
[im 56/100  soft-tissue]
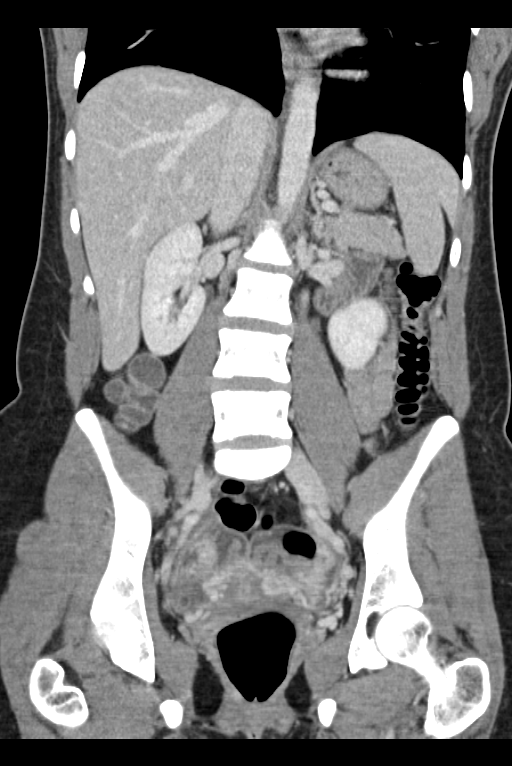

[16 of 46 positions shown; findings below may reference images not displayed]

FINDINGS: Lung bases: Clear.  Heart normal size.

Liver, spleen, gallbladder, pancreas, adrenal glands:  Normal.

Kidneys, ureters, bladder:  Normal.

Uterus and adnexa:  Unremarkable.

Lymph nodes:  No adenopathy.

Gastrointestinal: The appendix is dilated to 12 mm. There is
surrounding periappendiceal inflammation. No extraluminal air or
defined collection to suggest an abscess. Stomach colon and small
bowel are unremarkable.

Musculoskeletal:  Unremarkable.
IMPRESSION: 1. Acute appendicitis. No CT evidence of appendiceal rupture or
abscess.
